# Patient Record
Sex: Male | Born: 1985 | Race: Black or African American | Hispanic: No | Marital: Single | Smoking: Never smoker
Health system: Southern US, Community
[De-identification: ages and names within clinical notes are randomized; demographics above are authoritative.]

---
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
## 2007-07-26 IMAGING — CR DG LUMBAR SPINE COMPLETE 4+V
5 series · 5 of 5 positions shown · non-contrast
Comparison: None.

CLINICAL DATA: Low back pain ? no known injury. 
 LUMBAR SPINE ? 4 VIEW:

[t l-spine a.p.]
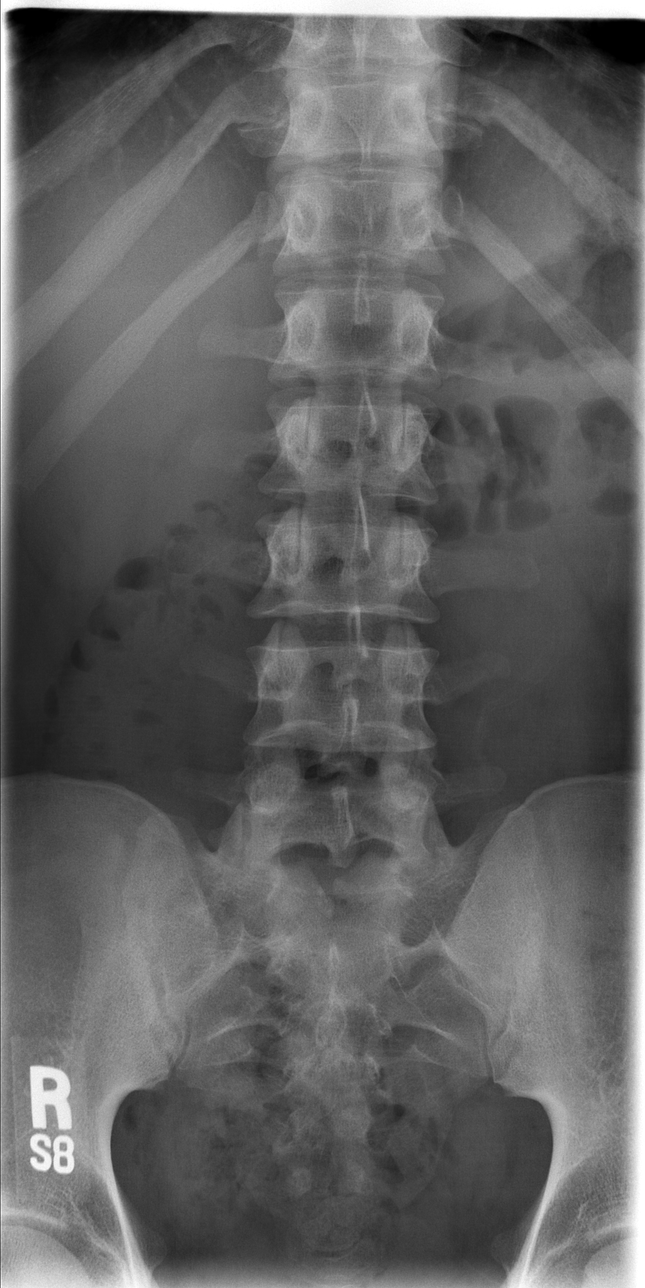

[t l-spine oblique exposure (1 of 2)]
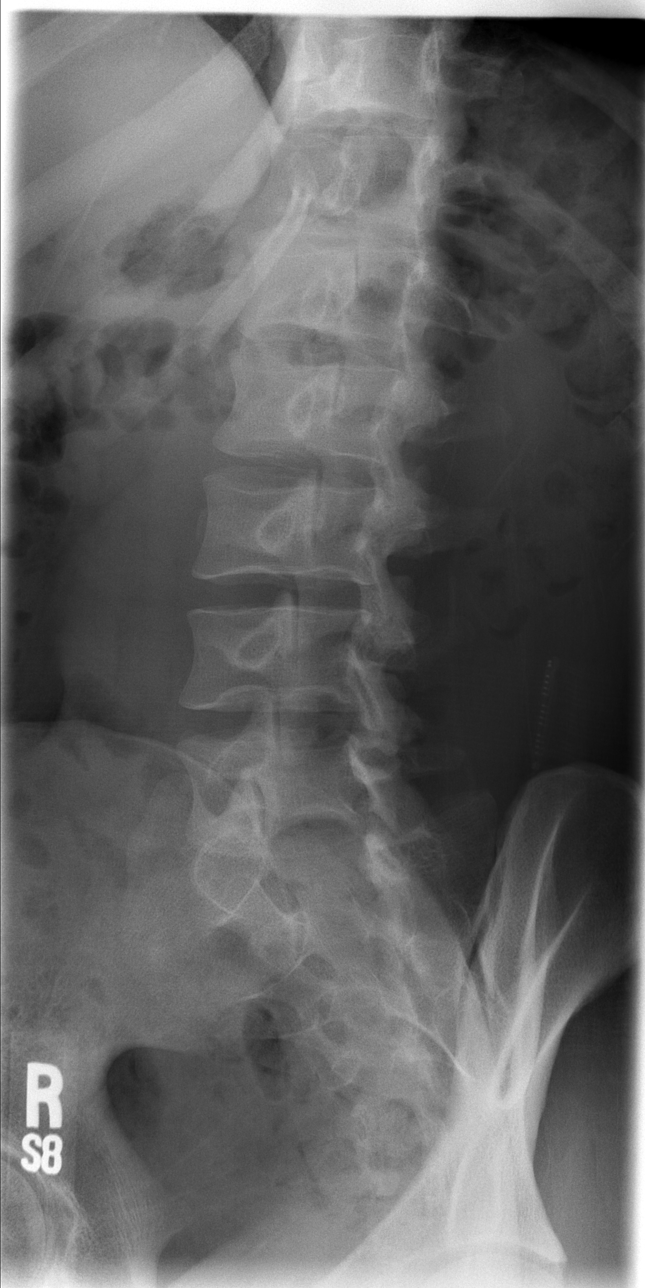

[t l-spine oblique exposure (2 of 2)]
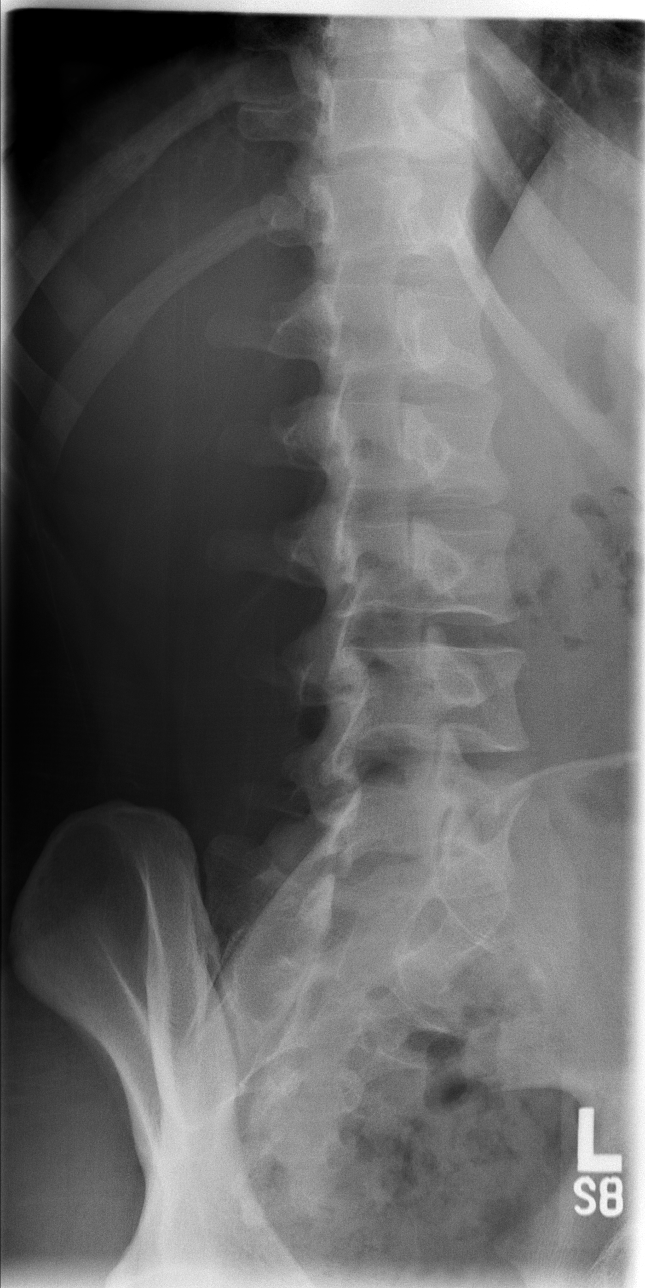

[t l-spine lat]
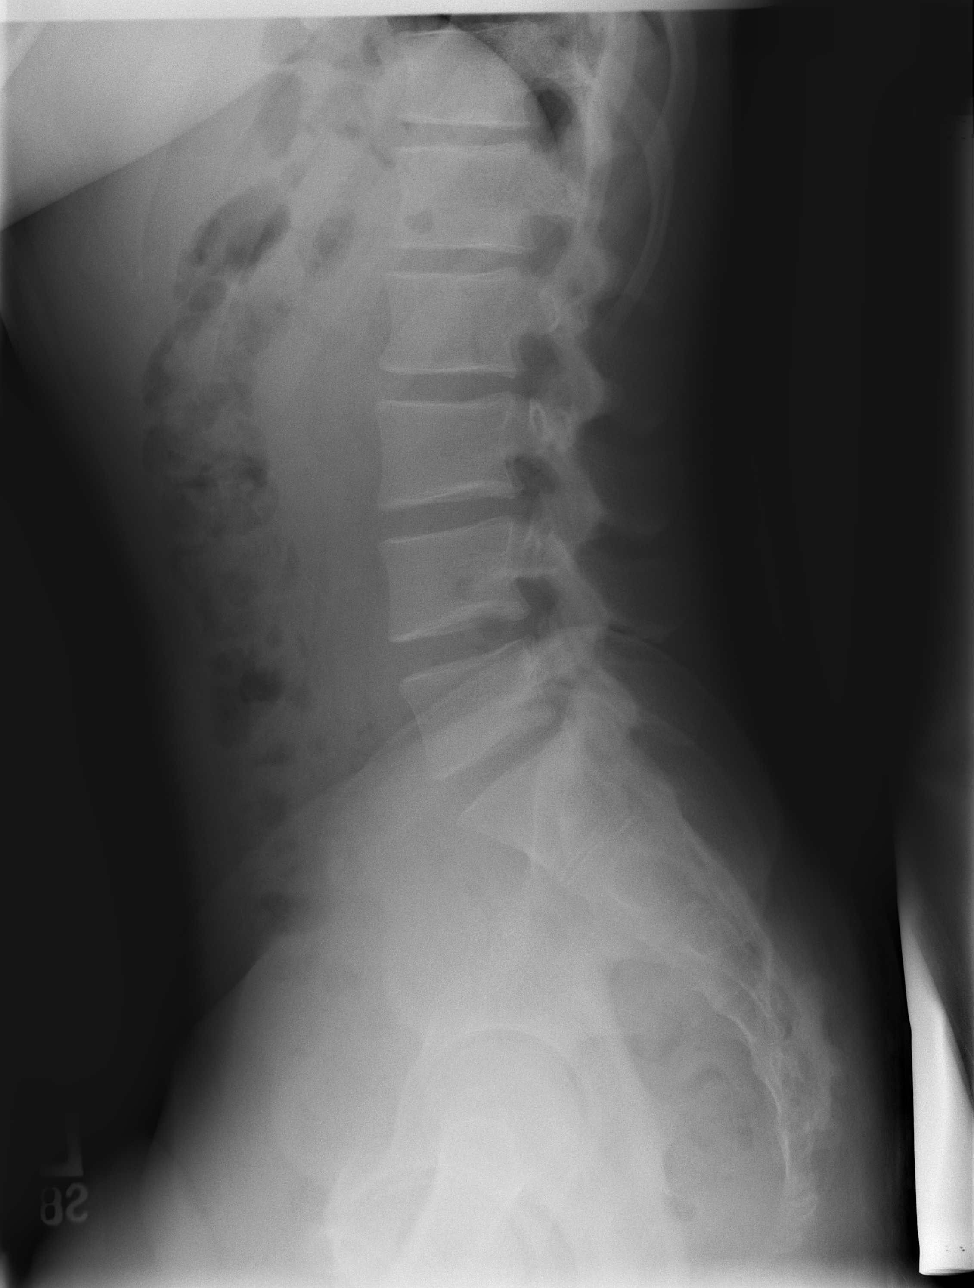

[t l-spine l5-s1 spot]
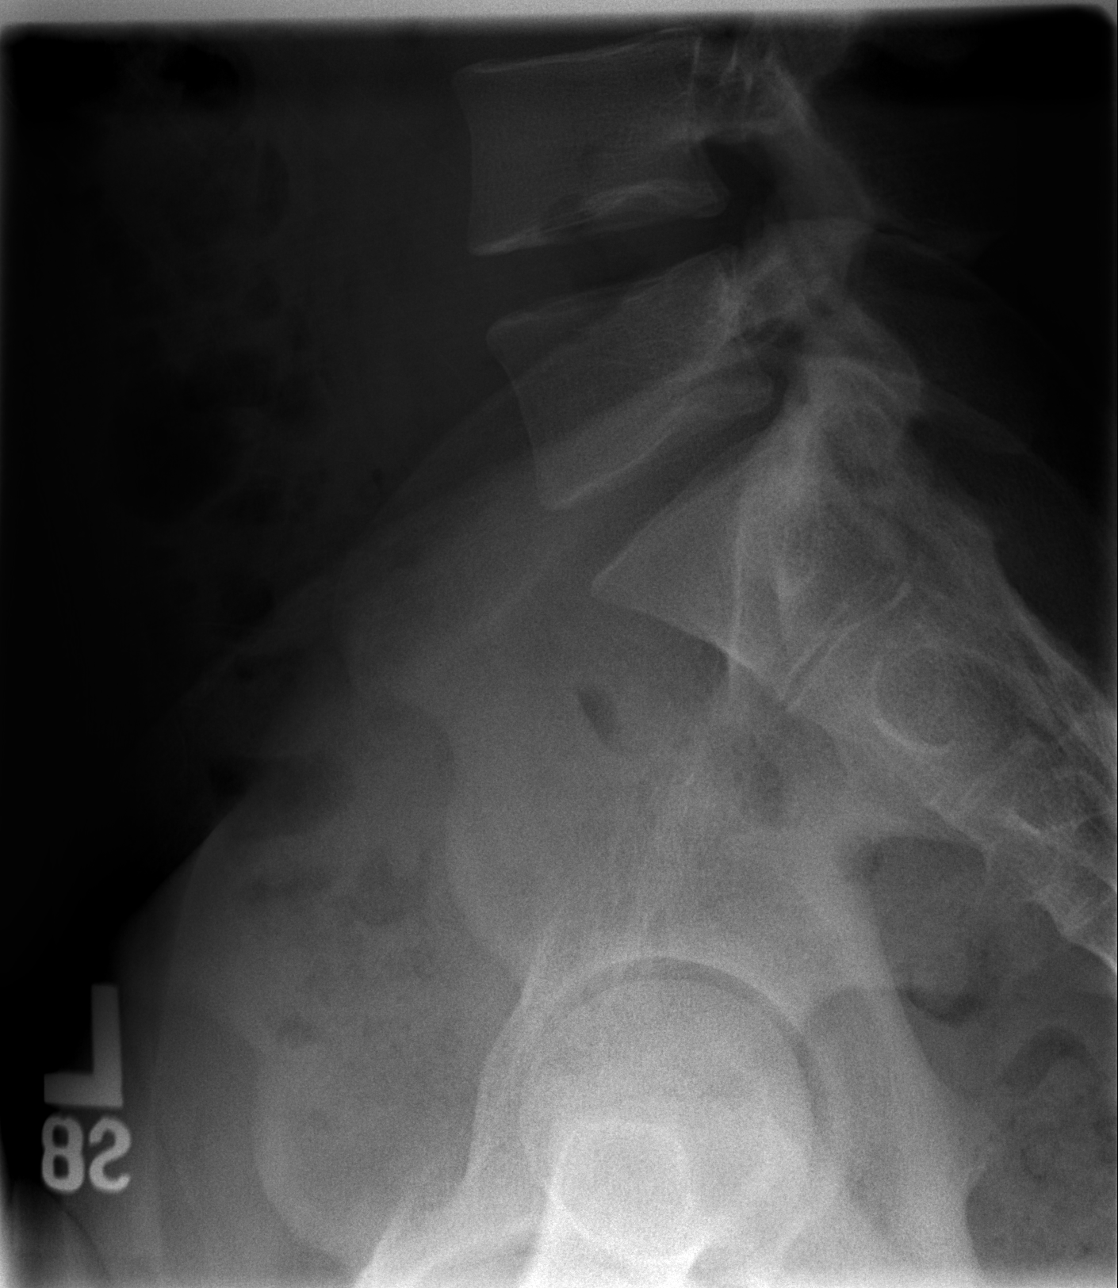

[5 of 5 positions shown; findings below may reference images not displayed]

FINDINGS: There are five non-rib bearing lumbar vertebrae.  No subluxation or fractures.  Disc height within normal limits.  There is spina bifida occulta at L-1.   Soft tissues normal.
IMPRESSION: No acute or significant findings.

## 2013-07-13 ENCOUNTER — Emergency Department (HOSPITAL_COMMUNITY)
Admission: EM | Admit: 2013-07-13 | Discharge: 2013-07-13 | Disposition: A | Discharge status: Home / Self Care | Payer: No Typology Code available for payment source | Attending: Emergency Medicine | Admitting: Emergency Medicine

## 2013-07-13 ENCOUNTER — Encounter (HOSPITAL_COMMUNITY): Payer: Self-pay | Admitting: Emergency Medicine

## 2013-07-13 DIAGNOSIS — M542 Cervicalgia: Secondary | ICD-10-CM

## 2013-07-13 DIAGNOSIS — S4980XA Other specified injuries of shoulder and upper arm, unspecified arm, initial encounter: Secondary | ICD-10-CM | POA: Insufficient documentation

## 2013-07-13 DIAGNOSIS — IMO0002 Reserved for concepts with insufficient information to code with codable children: Secondary | ICD-10-CM | POA: Insufficient documentation

## 2013-07-13 DIAGNOSIS — Y92410 Unspecified street and highway as the place of occurrence of the external cause: Secondary | ICD-10-CM | POA: Insufficient documentation

## 2013-07-13 DIAGNOSIS — S0993XA Unspecified injury of face, initial encounter: Secondary | ICD-10-CM | POA: Insufficient documentation

## 2013-07-13 DIAGNOSIS — S199XXA Unspecified injury of neck, initial encounter: Principal | ICD-10-CM

## 2013-07-13 DIAGNOSIS — Y9389 Activity, other specified: Secondary | ICD-10-CM | POA: Insufficient documentation

## 2013-07-13 DIAGNOSIS — M549 Dorsalgia, unspecified: Secondary | ICD-10-CM

## 2013-07-13 DIAGNOSIS — S46909A Unspecified injury of unspecified muscle, fascia and tendon at shoulder and upper arm level, unspecified arm, initial encounter: Secondary | ICD-10-CM | POA: Insufficient documentation

## 2013-07-13 MED ORDER — TRAMADOL HCL 50 MG PO TABS: 50.0000 mg | ORAL_TABLET | Freq: Four times a day (QID) | ORAL | Status: AC | PRN

## 2013-07-13 MED ORDER — CYCLOBENZAPRINE HCL 10 MG PO TABS: 10.0000 mg | ORAL_TABLET | Freq: Two times a day (BID) | ORAL | Status: AC | PRN
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
## 2019-05-18 ENCOUNTER — Other Ambulatory Visit: Payer: Self-pay

## 2019-05-18 ENCOUNTER — Ambulatory Visit (HOSPITAL_COMMUNITY)
Admission: EM | Admit: 2019-05-18 | Discharge: 2019-05-18 | Disposition: A | Discharge status: Home / Self Care | Payer: HRSA Program | Attending: Emergency Medicine | Admitting: Emergency Medicine

## 2019-05-18 ENCOUNTER — Encounter (HOSPITAL_COMMUNITY): Payer: Self-pay

## 2019-05-18 DIAGNOSIS — U071 COVID-19: Secondary | ICD-10-CM | POA: Diagnosis not present

## 2019-05-18 DIAGNOSIS — Z20822 Contact with and (suspected) exposure to covid-19: Secondary | ICD-10-CM

## 2019-05-18 NOTE — ED Provider Notes (Signed)
[facility]    CSN: [id number] Arrival date & time: 05/18/19  1651      History   Chief Complaint Chief Complaint  Patient presents with  . Covid Exposure    HPI Brent Strong is a 34 y.o. male.   Brent Strong presents with complaints of exposure to covid-19. A friend of his and her kids all tested positive. He has been around them the past three days, he is here visiting. They tested yesterday and found out today they tested positive. He states that yesterday he felt sweaty and some chills. This morning felt nasal drainage. Some body aches. No cough. No headache. Is supposed to fly back home on 4/6.    ROS per HPI, negative if not otherwise mentioned.      History reviewed. No pertinent past medical history.  There are no problems to display for this patient.   History reviewed. No pertinent surgical history.     Home Medications    Prior to Admission medications   Medication Sig Start Date End Date Taking? Authorizing Provider  cyclobenzaprine (FLEXERIL) 10 MG tablet Take 1 tablet (10 mg total) by mouth 2 (two) times daily as needed for muscle spasms. 07/13/13   Noe Gens, PA-C  traMADol (ULTRAM) 50 MG tablet Take 1 tablet (50 mg total) by mouth every 6 (six) hours as needed. 07/13/13   Noe Gens, PA-C    Family History Family History  Family history unknown: Yes    Social History Social History   Tobacco Use  . Smoking status: Never Smoker  Substance Use Topics  . Alcohol use: No  . Drug use: Yes    Types: Marijuana     Allergies   Patient has no known allergies.   Review of Systems Review of Systems   Physical Exam Triage Vital Signs ED Triage Vitals  Enc Vitals Group     BP 05/18/19 1731 117/73     Pulse Rate 05/18/19 1731 85     Resp 05/18/19 1731 18     Temp 05/18/19 1731 99.6 F (37.6 C)     Temp Source 05/18/19 1731 Oral     SpO2 05/18/19 1731 96 %     Weight --      Height --      Head Circumference --       Peak Flow --      Pain Score 05/18/19 1729 0     Pain Loc --      Pain Edu? --      Excl. in [place]? --    No data found.  Updated Vital Signs BP 117/73 (BP Location: Right Arm)   Pulse 85   Temp 99.6 F (37.6 C) (Oral)   Resp 18   SpO2 96%    Physical Exam Constitutional:      Appearance: He is well-developed.  Cardiovascular:     Rate and Rhythm: Normal rate.  Pulmonary:     Effort: Pulmonary effort is normal.  Skin:    General: Skin is warm and dry.  Neurological:     Mental Status: He is alert and oriented to person, place, and time.      UC Treatments / Results  Labs (all labs ordered are listed, but only abnormal results are displayed) Labs Reviewed  SARS CORONAVIRUS 2 (TAT 6-24 HRS) - Abnormal; Notable for the following components:      Result Value   SARS Coronavirus 2 POSITIVE (*)  All other components within normal limits    EKG   Radiology No results found.  Procedures Procedures (including critical care time)  Medications Ordered in UC Medications - No data to display  Initial Impression / Assessment and Plan / UC Course  I have reviewed the triage vital signs and the nursing notes.  Pertinent labs & imaging results that were available during my care of the patient were reviewed by me and considered in my medical decision making (see chart for details).     covid exposure and potentially mild vague symptoms. Non toxic. Benign physical exam.  Discussed risk of false negative as he was around those who are positive only 3 days ago. Encouraged isolation until tests results are back. If negative I still do not recommend flying on 4/6, but rather retesting to ensure safe to fly related to exposure. Return precautions provided. Patient verbalized understanding and agreeable to plan.   Final Clinical Impressions(s) / UC Diagnoses   Final diagnoses:  Exposure to COVID-19 virus  Encounter for laboratory testing for COVID-19 virus      Discharge Instructions     I am concerned that today is too early for your test. If your test is negative I recommend testing 5 days after your exposure, which would be 4/6 Isolate until that testing has been completed.  See provided information about covid-19 as needed.  You may monitor your results on your MyChart online    ED Prescriptions    None     PDMP not reviewed this encounter.   Georgetta Haber, NP 05/19/19 1142

## 2019-05-18 NOTE — ED Triage Notes (Signed)
Pt presents for covid testing after exposure; no known symptoms.

## 2019-05-18 NOTE — Discharge Instructions (Addendum)
I am concerned that today is too early for your test. If your test is negative I recommend testing 5 days after your exposure, which would be 4/6 Isolate until that testing has been completed.  See provided information about covid-19 as needed.  You may monitor your results on your MyChart online

## 2019-05-19 ENCOUNTER — Telehealth (HOSPITAL_COMMUNITY): Payer: Self-pay | Admitting: *Deleted

## 2019-05-19 LAB — SARS CORONAVIRUS 2 (TAT 6-24 HRS): SARS Coronavirus 2: POSITIVE — AB

## 2019-05-19 NOTE — Telephone Encounter (Signed)
Results given to patient. Questions answered. Verbal permission obtained to speak with patient parole officer as they are visiting from [place] and were due to fly back tomorrow.   Your test for COVID-19 was positive ("detected"), meaning that you were infected with the novel coronavirus and could give the germ to others.    Please continue isolation at home, for at least 10 days since the start of your fever/cough/breathlessness and until you have had 24 hours without fever (without taking a fever reducer) and with any cough/breathlessness improving. Use over-the-counter medications for symptoms. Quarantine 05/18/19-05/28/19.  Please continue good preventive care measures, including:  frequent hand-washing, avoid touching your face, cover coughs/sneezes, stay out of crowds and keep a 6 foot distance from others.  Clean hard surfaces touched frequently with disinfectant cleaning products.   Please check in with your primary care provider about your positive test result.  Go to the nearest urgent care or ED for assessment if you have severe breathlessness or severe weakness/fatigue (ex needing new help getting out of bed or to the bathroom).  Members of your household will also need to quarantine for 14 days from the date of your positive test. You may be contacted to discuss possible treatment options, and you may also be contacted by the health department for follow up. Please call [facility] at [phone] if you have any questions or concerns.
